# Patient Record
Sex: Female | Born: 1965 | Race: White | Hispanic: No | Marital: Married | Smoking: Never smoker
Health system: Southern US, Community
[De-identification: ages and names within clinical notes are randomized; demographics above are authoritative.]

## PROBLEM LIST (undated history)

## (undated) HISTORY — PX: ABDOMINAL HYSTERECTOMY: SHX81

---

## 2010-08-19 ENCOUNTER — Ambulatory Visit: Payer: Self-pay | Admitting: Obstetrics and Gynecology

## 2015-03-15 ENCOUNTER — Other Ambulatory Visit: Payer: Self-pay | Admitting: Student

## 2015-03-15 DIAGNOSIS — R1011 Right upper quadrant pain: Secondary | ICD-10-CM

## 2015-03-22 ENCOUNTER — Ambulatory Visit
Admission: RE | Admit: 2015-03-22 | Discharge: 2015-03-22 | Disposition: A | Discharge status: Home / Self Care | Payer: BLUE CROSS/BLUE SHIELD | Source: Ambulatory Visit | Attending: Student | Admitting: Student

## 2015-03-22 DIAGNOSIS — R1011 Right upper quadrant pain: Secondary | ICD-10-CM | POA: Diagnosis present

## 2015-03-22 DIAGNOSIS — K760 Fatty (change of) liver, not elsewhere classified: Secondary | ICD-10-CM | POA: Diagnosis not present

## 2015-05-24 ENCOUNTER — Other Ambulatory Visit: Payer: Self-pay | Admitting: Nurse Practitioner

## 2015-05-24 DIAGNOSIS — E049 Nontoxic goiter, unspecified: Secondary | ICD-10-CM

## 2015-05-31 ENCOUNTER — Ambulatory Visit
Admission: RE | Admit: 2015-05-31 | Discharge: 2015-05-31 | Disposition: A | Discharge status: Home / Self Care | Payer: BLUE CROSS/BLUE SHIELD | Source: Ambulatory Visit | Attending: Nurse Practitioner | Admitting: Nurse Practitioner

## 2015-05-31 DIAGNOSIS — E049 Nontoxic goiter, unspecified: Secondary | ICD-10-CM | POA: Diagnosis not present

## 2015-10-08 ENCOUNTER — Other Ambulatory Visit: Payer: Self-pay | Admitting: Nurse Practitioner

## 2015-10-08 DIAGNOSIS — Z1231 Encounter for screening mammogram for malignant neoplasm of breast: Secondary | ICD-10-CM

## 2015-10-18 ENCOUNTER — Ambulatory Visit
Admission: RE | Admit: 2015-10-18 | Discharge: 2015-10-18 | Disposition: A | Discharge status: Home / Self Care | Payer: BLUE CROSS/BLUE SHIELD | Source: Ambulatory Visit | Attending: Nurse Practitioner | Admitting: Nurse Practitioner

## 2015-10-18 ENCOUNTER — Other Ambulatory Visit: Payer: Self-pay | Admitting: Nurse Practitioner

## 2015-10-18 DIAGNOSIS — Z1231 Encounter for screening mammogram for malignant neoplasm of breast: Secondary | ICD-10-CM | POA: Diagnosis not present

## 2016-10-23 ENCOUNTER — Other Ambulatory Visit: Payer: Self-pay | Admitting: Nurse Practitioner

## 2016-10-23 DIAGNOSIS — Z1239 Encounter for other screening for malignant neoplasm of breast: Secondary | ICD-10-CM

## 2016-12-02 ENCOUNTER — Encounter: Payer: Self-pay | Admitting: Radiology

## 2016-12-02 ENCOUNTER — Ambulatory Visit
Admission: RE | Admit: 2016-12-02 | Discharge: 2016-12-02 | Disposition: A | Discharge status: Home / Self Care | Payer: BLUE CROSS/BLUE SHIELD | Source: Ambulatory Visit | Attending: Nurse Practitioner | Admitting: Nurse Practitioner

## 2016-12-02 DIAGNOSIS — Z1239 Encounter for other screening for malignant neoplasm of breast: Secondary | ICD-10-CM

## 2016-12-02 DIAGNOSIS — Z1231 Encounter for screening mammogram for malignant neoplasm of breast: Secondary | ICD-10-CM | POA: Diagnosis not present

## 2017-09-20 ENCOUNTER — Ambulatory Visit (INDEPENDENT_AMBULATORY_CARE_PROVIDER_SITE_OTHER): Payer: BLUE CROSS/BLUE SHIELD

## 2017-09-20 ENCOUNTER — Ambulatory Visit
Admission: EM | Admit: 2017-09-20 | Discharge: 2017-09-20 | Disposition: A | Discharge status: Home / Self Care | Payer: BLUE CROSS/BLUE SHIELD | Attending: Family Medicine | Admitting: Family Medicine

## 2017-09-20 ENCOUNTER — Other Ambulatory Visit: Payer: Self-pay

## 2017-09-20 DIAGNOSIS — S022XXA Fracture of nasal bones, initial encounter for closed fracture: Secondary | ICD-10-CM

## 2017-09-20 DIAGNOSIS — W228XXA Striking against or struck by other objects, initial encounter: Secondary | ICD-10-CM | POA: Diagnosis not present

## 2017-09-20 DIAGNOSIS — W19XXXA Unspecified fall, initial encounter: Secondary | ICD-10-CM

## 2017-09-20 DIAGNOSIS — W108XXA Fall (on) (from) other stairs and steps, initial encounter: Secondary | ICD-10-CM

## 2017-09-20 DIAGNOSIS — Z23 Encounter for immunization: Secondary | ICD-10-CM | POA: Diagnosis not present

## 2017-09-20 MED ORDER — PHENYLEPHRINE HCL 0.25 % NA SOLN: 1.0000 | Freq: Four times a day (QID) | NASAL | Status: DC | PRN

## 2017-09-20 MED ORDER — TETANUS-DIPHTH-ACELL PERTUSSIS 5-2.5-18.5 LF-MCG/0.5 IM SUSP
0.5000 mL | Freq: Once | INTRAMUSCULAR | Status: AC
  Administered 2017-09-20: 0.5 mL via INTRAMUSCULAR

## 2017-09-20 NOTE — Discharge Instructions (Signed)
-  ice compresses for 10-15 minutes 4-6 times a day -can use the nasal spray, 4-6 sprays into the nostril for bleeding. Afrin -follow up with ENT next week, info provided -go to ER if have heavy bleeding that cannot stop for have difficulty breathing

## 2017-09-20 NOTE — ED Provider Notes (Signed)
[facility]    CSN: [id number] Arrival date & time: 09/20/17  0855     History   Chief Complaint Chief Complaint  Patient presents with  . Fall  . Facial Pain    HPI Alisha Pierce is a 52 y.o. female.   Patient is a 52 year old female who states she was walking up the steps at work carrying items in both hands when she tripped and fell forward, hitting her nose on the metal handrail.  Patient states the injury occurred about 805 this morning.  Patient denies any trouble with air movement through her nose but does report it is painful to touch.  She did report some heavy bleeding for about 30 minutes but now reports just a little bit of oozing bleeding.  She does note that there is some bruising already started.  Patient does take 81 mg of aspirin every morning.  She also states that she took 600 milligrams of ibuprofen at about 830 this morning.  Patient does not remember when her last tetanus shot was      History reviewed. No pertinent past medical history.  There are no active problems to display for this patient.   Past Surgical History:  Procedure Laterality Date  . ABDOMINAL HYSTERECTOMY    . CESAREAN SECTION      OB History    No data available       Home Medications    Prior to Admission medications   Medication Sig Start Date End Date Taking? Authorizing Provider  cefdinir (OMNICEF) 300 MG capsule Take 300 mg by mouth 2 (two) times daily.   Yes [provider]  levothyroxine (SYNTHROID, LEVOTHROID) 75 MCG tablet Take 75 mcg by mouth daily before breakfast.   Yes [provider]  metFORMIN (GLUCOPHAGE) 1000 MG tablet Take 1,000 mg by mouth 2 (two) times daily with a meal.   Yes [provider]  triamterene-hydrochlorothiazide (DYAZIDE) 37.5-25 MG capsule Take 1 capsule by mouth daily.   Yes [provider]    Family History Family History  Problem Relation Age of Onset  . Diabetes Father     Social  History Social History   Tobacco Use  . Smoking status: Never Smoker  . Smokeless tobacco: Never Used  Substance Use Topics  . Alcohol use: Yes    Frequency: Never    Comment: occasionally  . Drug use: No     Allergies   Patient has no known allergies.   Review of Systems Review of Systems  As noted above in HPI.  Other systems reviewed and found to be negative   Physical Exam Triage Vital Signs ED Triage Vitals  Enc Vitals Group     BP 09/20/17 0931 (!) 126/98     Pulse Rate 09/20/17 0931 (!) 102     Resp 09/20/17 0931 18     Temp 09/20/17 0931 98.5 F (36.9 C)     Temp Source 09/20/17 0931 Oral     SpO2 09/20/17 0931 98 %     Weight 09/20/17 0928 175 lb (79.4 kg)     Height 09/20/17 0928 5\' 4"  (1.626 m)     Head Circumference --      Peak Flow --      Pain Score 09/20/17 0928 5     Pain Loc --      Pain Edu? --      Excl. in GC? --    No data found.  Updated Vital Signs BP Marland Kitchen)  126/98 (BP Location: Left Arm)   Pulse (!) 102   Temp 98.5 F (36.9 C) (Oral)   Resp 18   Ht 5\' 4"  (1.626 m)   Wt 175 lb (79.4 kg)   SpO2 98%   BMI 30.04 kg/m    Physical Exam  Constitutional: She appears well-developed and well-nourished. No distress.  HENT:  Nose:    About 4 areas of minimal oozing bleeding.  No obvious large lacerations or sores inside the nose.  Some asymmetry in regards to the alignment of the nose though minimal,   Eyes: Pupils are equal, round, and reactive to light.  Pulmonary/Chest: Breath sounds normal. No stridor. No respiratory distress.     UC Treatments / Results  Labs (all labs ordered are listed, but only abnormal results are displayed) Labs Reviewed - No data to display  EKG  EKG Interpretation None       Radiology Dg Nasal Bones  Result Date: 09/20/2017 CLINICAL DATA:  Pain following fall EXAM: NASAL BONES - 3+ VIEW COMPARISON:  None. FINDINGS: Right lateral, left lateral, and Waters views obtained. There is a fracture of  the distal nasion with mild displacement of fracture fragments. No other fractures evident. No dislocation. Paranasal sinuses appear clear. Mastoid air cells are clear. IMPRESSION: Mildly displaced fracture distal nasion. No other abnormality appreciable. Electronically Signed   By: Bretta BangWilliam  Woodruff III M.D.   On: 09/20/2017 11:03    Procedures Procedures (including critical care time)  Medications Ordered in UC Medications  phenylephrine (NEO-SYNEPHRINE) 0.25 % nasal spray 1 spray (not administered)  Tdap (BOOSTRIX) injection 0.5 mL (0.5 mLs Intramuscular Given 09/20/17 1104)     Initial Impression / Assessment and Plan / UC Course  I have reviewed the triage vital signs and the nursing notes.  Pertinent labs & imaging results that were available during my care of the patient were reviewed by me and considered in my medical decision making (see chart for details).    Patient with fall at work while walking up steps, striking her nose on a metal handrail.  Patient denies any respiratory difficulty or obstruction of flow through her nose.  Patient does take an 81 mg baby aspirin daily  A Neo-Synephrine nasal solution was soaked into a 4 x 4 gauze and inserted into the left naris to help control bleeding.  Ice pack was placed upon arrival.  Due to early bruising and some tenderness asymmetric look to the nose, a nasal x-ray was ordered.  Final Clinical Impressions(s) / UC Diagnoses   Final diagnoses:  Fall, initial encounter  Closed fracture of nasal bone, initial encounter    ED Discharge Orders    None     Patient with distal nasal bone fracture as above.  We will have her continue with ibuprofen and Tylenol as needed for pain.  Ice compresses 4-6 times a day.  I have her continue using the nasal spray as needed for any bleeding.  Have her go to the ER should her bleeding become heavy and she is unable to stop it or should she have any difficulty breathing.  We will have her  follow-up with ENT next week.   Controlled Substance Prescriptions [place] Controlled Substance Registry consulted? Not Applicable   Candis SchatzHarris, Norberta Stobaugh D, PA-C 09/20/17 1122

## 2017-09-20 NOTE — ED Triage Notes (Signed)
Patient reports that was walking in to work this morning and fell up concrete steps and landed on her face. Patient has significant nasal swelling and beginning to bruise. Patient reports nose bleed but has resolved at this time. Patient currently applying pressure and ice to face.

## 2017-10-20 ENCOUNTER — Other Ambulatory Visit: Payer: Self-pay | Admitting: Nurse Practitioner

## 2017-10-20 DIAGNOSIS — S0292XS Unspecified fracture of facial bones, sequela: Secondary | ICD-10-CM

## 2017-10-20 DIAGNOSIS — R42 Dizziness and giddiness: Secondary | ICD-10-CM

## 2017-10-20 DIAGNOSIS — H538 Other visual disturbances: Secondary | ICD-10-CM

## 2017-10-20 DIAGNOSIS — R51 Headache: Secondary | ICD-10-CM

## 2017-10-20 DIAGNOSIS — R519 Headache, unspecified: Secondary | ICD-10-CM

## 2017-10-22 ENCOUNTER — Encounter (INDEPENDENT_AMBULATORY_CARE_PROVIDER_SITE_OTHER): Payer: Self-pay

## 2017-10-22 ENCOUNTER — Ambulatory Visit
Admission: RE | Admit: 2017-10-22 | Discharge: 2017-10-22 | Disposition: A | Discharge status: Home / Self Care | Payer: BLUE CROSS/BLUE SHIELD | Source: Ambulatory Visit | Attending: Nurse Practitioner | Admitting: Nurse Practitioner

## 2017-10-22 DIAGNOSIS — R42 Dizziness and giddiness: Secondary | ICD-10-CM

## 2017-10-22 DIAGNOSIS — R51 Headache: Secondary | ICD-10-CM | POA: Insufficient documentation

## 2017-10-22 DIAGNOSIS — R519 Headache, unspecified: Secondary | ICD-10-CM

## 2017-10-22 DIAGNOSIS — H538 Other visual disturbances: Secondary | ICD-10-CM

## 2017-10-22 DIAGNOSIS — S0292XS Unspecified fracture of facial bones, sequela: Secondary | ICD-10-CM | POA: Insufficient documentation

## 2018-01-18 ENCOUNTER — Other Ambulatory Visit: Payer: Self-pay | Admitting: Nurse Practitioner

## 2018-01-18 DIAGNOSIS — Z1231 Encounter for screening mammogram for malignant neoplasm of breast: Secondary | ICD-10-CM

## 2018-02-02 ENCOUNTER — Ambulatory Visit
Admission: RE | Admit: 2018-02-02 | Discharge: 2018-02-02 | Disposition: A | Discharge status: Home / Self Care | Payer: BLUE CROSS/BLUE SHIELD | Source: Ambulatory Visit | Attending: Nurse Practitioner | Admitting: Nurse Practitioner

## 2018-02-02 DIAGNOSIS — Z1231 Encounter for screening mammogram for malignant neoplasm of breast: Secondary | ICD-10-CM | POA: Diagnosis not present

## 2018-07-29 ENCOUNTER — Other Ambulatory Visit: Payer: Self-pay | Admitting: Student

## 2018-07-29 DIAGNOSIS — R1011 Right upper quadrant pain: Secondary | ICD-10-CM

## 2018-07-29 DIAGNOSIS — R110 Nausea: Secondary | ICD-10-CM

## 2018-08-09 ENCOUNTER — Ambulatory Visit: Payer: BLUE CROSS/BLUE SHIELD

## 2018-08-19 ENCOUNTER — Ambulatory Visit
Admission: RE | Admit: 2018-08-19 | Discharge: 2018-08-19 | Disposition: A | Discharge status: Home / Self Care | Payer: BLUE CROSS/BLUE SHIELD | Source: Ambulatory Visit | Attending: Student | Admitting: Student

## 2018-08-19 DIAGNOSIS — R1011 Right upper quadrant pain: Secondary | ICD-10-CM | POA: Diagnosis present

## 2018-08-19 DIAGNOSIS — R110 Nausea: Secondary | ICD-10-CM

## 2018-08-19 MED ORDER — TECHNETIUM TC 99M MEBROFENIN IV KIT
5.3000 | PACK | Freq: Once | INTRAVENOUS | Status: AC | PRN
  Administered 2018-08-19: 5.3 via INTRAVENOUS

## 2018-08-30 ENCOUNTER — Other Ambulatory Visit: Payer: Self-pay | Admitting: Student

## 2018-08-30 DIAGNOSIS — R1011 Right upper quadrant pain: Secondary | ICD-10-CM

## 2018-09-09 ENCOUNTER — Other Ambulatory Visit: Payer: Self-pay | Admitting: Student

## 2018-09-09 DIAGNOSIS — R1011 Right upper quadrant pain: Secondary | ICD-10-CM

## 2018-09-09 DIAGNOSIS — K30 Functional dyspepsia: Secondary | ICD-10-CM

## 2018-09-09 DIAGNOSIS — R110 Nausea: Secondary | ICD-10-CM

## 2018-11-04 ENCOUNTER — Ambulatory Visit: Admission: RE | Admit: 2018-11-04 | Payer: BLUE CROSS/BLUE SHIELD | Source: Ambulatory Visit

## 2019-03-27 ENCOUNTER — Other Ambulatory Visit: Payer: Self-pay | Admitting: Nurse Practitioner

## 2019-03-27 DIAGNOSIS — Z1231 Encounter for screening mammogram for malignant neoplasm of breast: Secondary | ICD-10-CM

## 2019-04-10 ENCOUNTER — Ambulatory Visit
Admission: RE | Admit: 2019-04-10 | Discharge: 2019-04-10 | Disposition: A | Discharge status: Home / Self Care | Payer: BC Managed Care – PPO | Source: Ambulatory Visit | Attending: Nurse Practitioner | Admitting: Nurse Practitioner

## 2019-04-10 ENCOUNTER — Other Ambulatory Visit: Payer: Self-pay

## 2019-04-10 ENCOUNTER — Encounter (INDEPENDENT_AMBULATORY_CARE_PROVIDER_SITE_OTHER): Payer: Self-pay

## 2019-04-10 DIAGNOSIS — Z1231 Encounter for screening mammogram for malignant neoplasm of breast: Secondary | ICD-10-CM | POA: Insufficient documentation

## 2019-12-13 ENCOUNTER — Other Ambulatory Visit: Payer: Self-pay | Admitting: Student

## 2019-12-13 DIAGNOSIS — R1011 Right upper quadrant pain: Secondary | ICD-10-CM

## 2019-12-13 DIAGNOSIS — R1032 Left lower quadrant pain: Secondary | ICD-10-CM

## 2019-12-20 ENCOUNTER — Ambulatory Visit
Admission: RE | Admit: 2019-12-20 | Discharge: 2019-12-20 | Disposition: A | Discharge status: Home / Self Care | Payer: BC Managed Care – PPO | Source: Ambulatory Visit | Attending: Student | Admitting: Student

## 2019-12-20 DIAGNOSIS — R1011 Right upper quadrant pain: Secondary | ICD-10-CM

## 2019-12-20 DIAGNOSIS — R1032 Left lower quadrant pain: Secondary | ICD-10-CM

## 2019-12-20 MED ORDER — IOPAMIDOL (ISOVUE-300) INJECTION 61%
100.0000 mL | Freq: Once | INTRAVENOUS | Status: AC | PRN
  Administered 2019-12-20: 100 mL via INTRAVENOUS

## 2020-05-07 ENCOUNTER — Other Ambulatory Visit: Payer: Self-pay | Admitting: Nurse Practitioner

## 2020-05-07 DIAGNOSIS — Z1231 Encounter for screening mammogram for malignant neoplasm of breast: Secondary | ICD-10-CM

## 2020-06-25 ENCOUNTER — Other Ambulatory Visit: Payer: Self-pay

## 2020-06-25 ENCOUNTER — Ambulatory Visit
Admission: RE | Admit: 2020-06-25 | Discharge: 2020-06-25 | Disposition: A | Discharge status: Home / Self Care | Payer: BC Managed Care – PPO | Source: Ambulatory Visit | Attending: Nurse Practitioner | Admitting: Nurse Practitioner

## 2020-06-25 DIAGNOSIS — Z1231 Encounter for screening mammogram for malignant neoplasm of breast: Secondary | ICD-10-CM | POA: Insufficient documentation

## 2021-05-27 ENCOUNTER — Other Ambulatory Visit: Payer: Self-pay | Admitting: Nurse Practitioner

## 2021-05-27 DIAGNOSIS — Z1231 Encounter for screening mammogram for malignant neoplasm of breast: Secondary | ICD-10-CM

## 2021-05-28 ENCOUNTER — Other Ambulatory Visit: Payer: Self-pay | Admitting: Nurse Practitioner

## 2021-05-28 DIAGNOSIS — R1011 Right upper quadrant pain: Secondary | ICD-10-CM

## 2021-06-04 ENCOUNTER — Other Ambulatory Visit: Payer: Self-pay

## 2021-06-04 ENCOUNTER — Ambulatory Visit
Admission: RE | Admit: 2021-06-04 | Discharge: 2021-06-04 | Disposition: A | Discharge status: Home / Self Care | Payer: BC Managed Care – PPO | Source: Ambulatory Visit | Attending: Nurse Practitioner | Admitting: Nurse Practitioner

## 2021-06-04 DIAGNOSIS — R1011 Right upper quadrant pain: Secondary | ICD-10-CM | POA: Insufficient documentation

## 2021-06-26 ENCOUNTER — Other Ambulatory Visit: Payer: Self-pay

## 2021-06-26 ENCOUNTER — Ambulatory Visit
Admission: RE | Admit: 2021-06-26 | Discharge: 2021-06-26 | Disposition: A | Discharge status: Home / Self Care | Payer: BC Managed Care – PPO | Source: Ambulatory Visit | Attending: Nurse Practitioner | Admitting: Nurse Practitioner

## 2021-06-26 DIAGNOSIS — Z1231 Encounter for screening mammogram for malignant neoplasm of breast: Secondary | ICD-10-CM | POA: Diagnosis not present

## 2021-06-27 ENCOUNTER — Other Ambulatory Visit: Payer: Self-pay | Admitting: Nurse Practitioner

## 2021-07-02 ENCOUNTER — Other Ambulatory Visit: Payer: Self-pay | Admitting: Nurse Practitioner

## 2021-07-02 DIAGNOSIS — R921 Mammographic calcification found on diagnostic imaging of breast: Secondary | ICD-10-CM

## 2021-07-02 DIAGNOSIS — R928 Other abnormal and inconclusive findings on diagnostic imaging of breast: Secondary | ICD-10-CM

## 2021-07-04 ENCOUNTER — Ambulatory Visit
Admission: RE | Admit: 2021-07-04 | Discharge: 2021-07-04 | Disposition: A | Discharge status: Home / Self Care | Payer: BC Managed Care – PPO | Source: Ambulatory Visit | Attending: Nurse Practitioner | Admitting: Nurse Practitioner

## 2021-07-04 ENCOUNTER — Other Ambulatory Visit: Payer: Self-pay

## 2021-07-04 DIAGNOSIS — R921 Mammographic calcification found on diagnostic imaging of breast: Secondary | ICD-10-CM | POA: Diagnosis present

## 2021-07-04 DIAGNOSIS — R928 Other abnormal and inconclusive findings on diagnostic imaging of breast: Secondary | ICD-10-CM | POA: Diagnosis present

## 2022-08-31 ENCOUNTER — Other Ambulatory Visit: Payer: Self-pay | Admitting: Nurse Practitioner

## 2022-08-31 DIAGNOSIS — Z1231 Encounter for screening mammogram for malignant neoplasm of breast: Secondary | ICD-10-CM

## 2022-09-18 ENCOUNTER — Ambulatory Visit
Admission: RE | Admit: 2022-09-18 | Discharge: 2022-09-18 | Disposition: A | Discharge status: Home / Self Care | Payer: BC Managed Care – PPO | Source: Ambulatory Visit | Attending: Nurse Practitioner | Admitting: Nurse Practitioner

## 2022-09-18 DIAGNOSIS — Z1231 Encounter for screening mammogram for malignant neoplasm of breast: Secondary | ICD-10-CM | POA: Insufficient documentation

## 2023-02-19 IMAGING — MG MM DIGITAL DIAGNOSTIC UNILAT*R* W/ TOMO W/ CAD
4 series · 4 of 8 positions shown · non-contrast
Comparison: Previous exam(s).

CLINICAL DATA: 55-year-old female recalled from screening mammogram
dated 06/26/2021 for right breast calcifications.

EXAM:
DIGITAL DIAGNOSTIC UNILATERAL RIGHT MAMMOGRAM WITH TOMOSYNTHESIS AND
CAD
TECHNIQUE: Right digital diagnostic mammography and breast tomosynthesis was
performed. The images were evaluated with computer-aided detection.

[R ML]
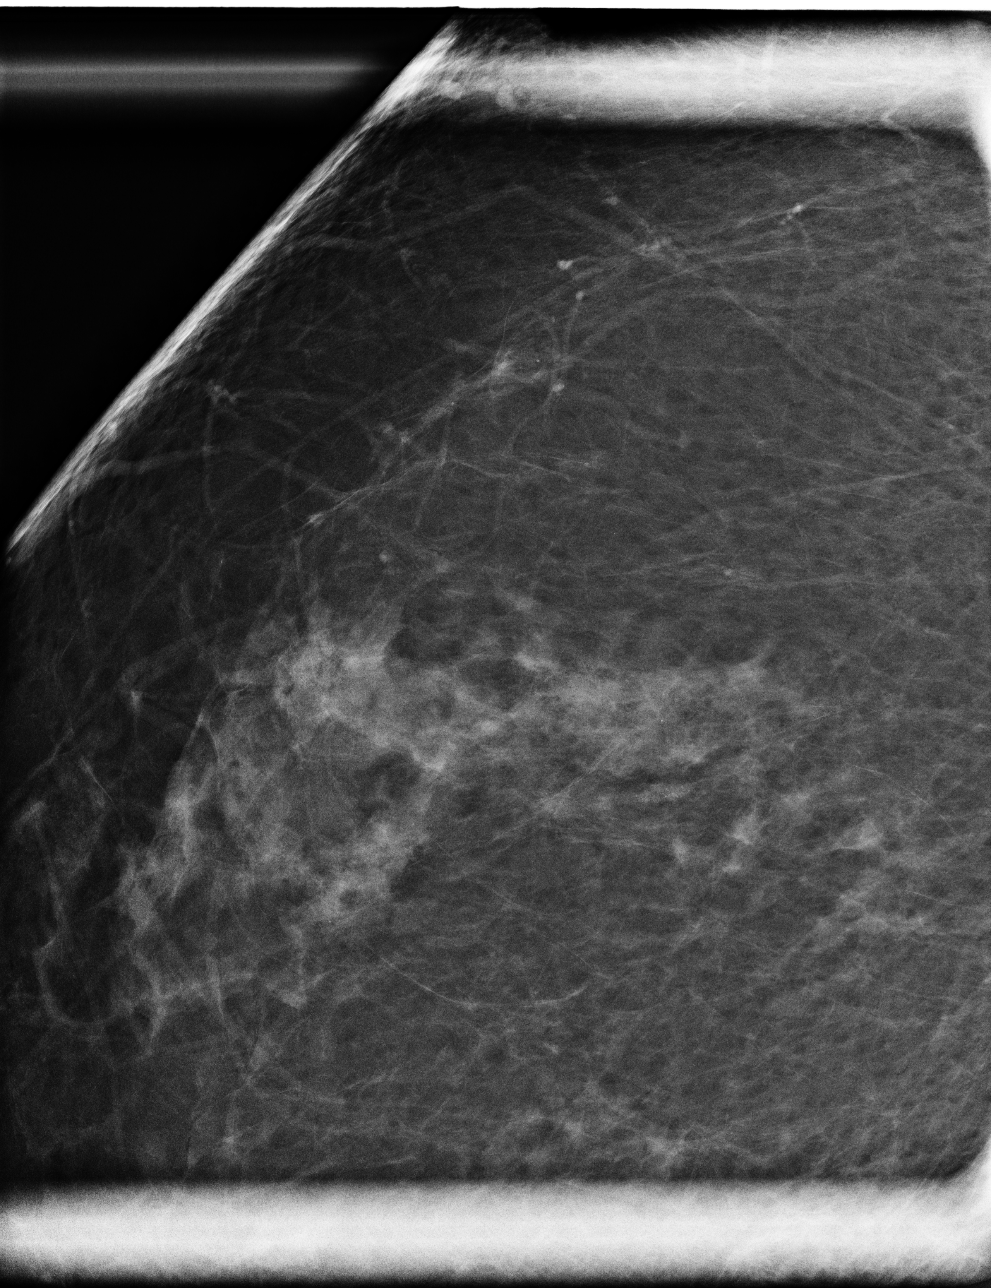

[R CC]
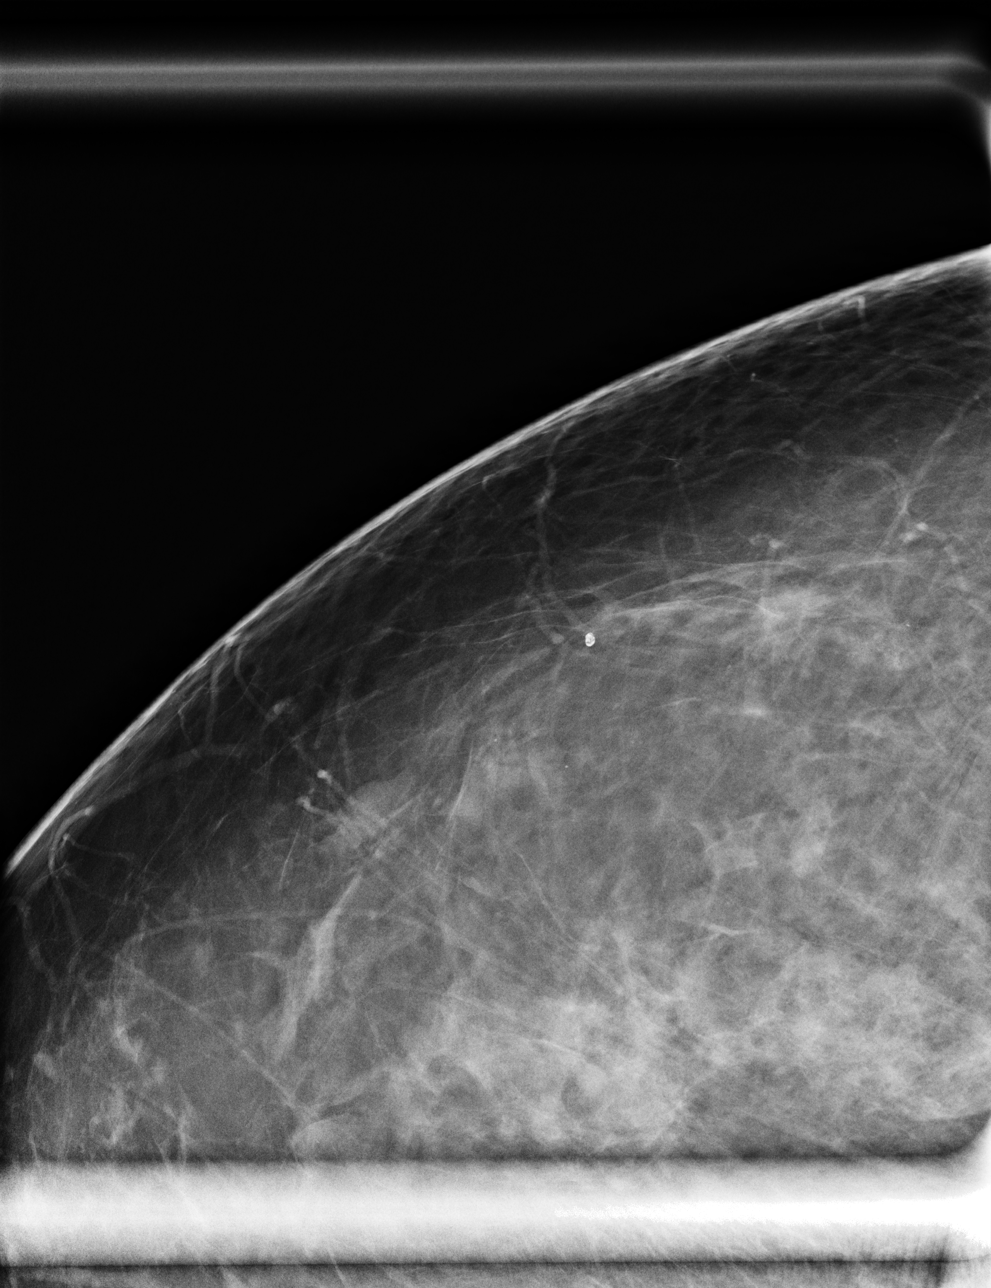

[R ML synth-2D]
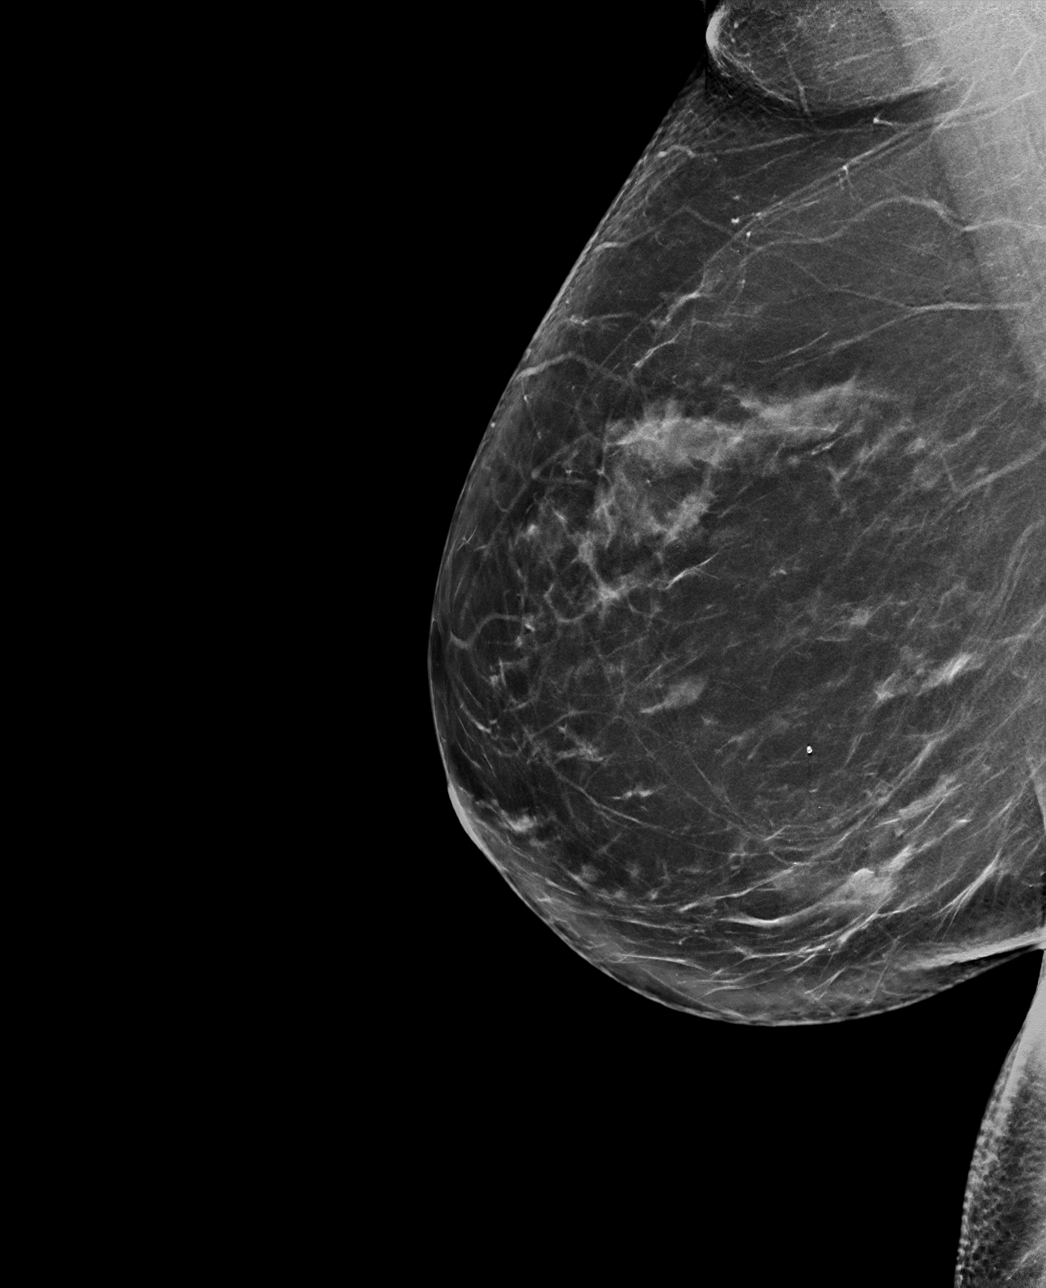

[R ML tomo · tomo slice 39/76.0]
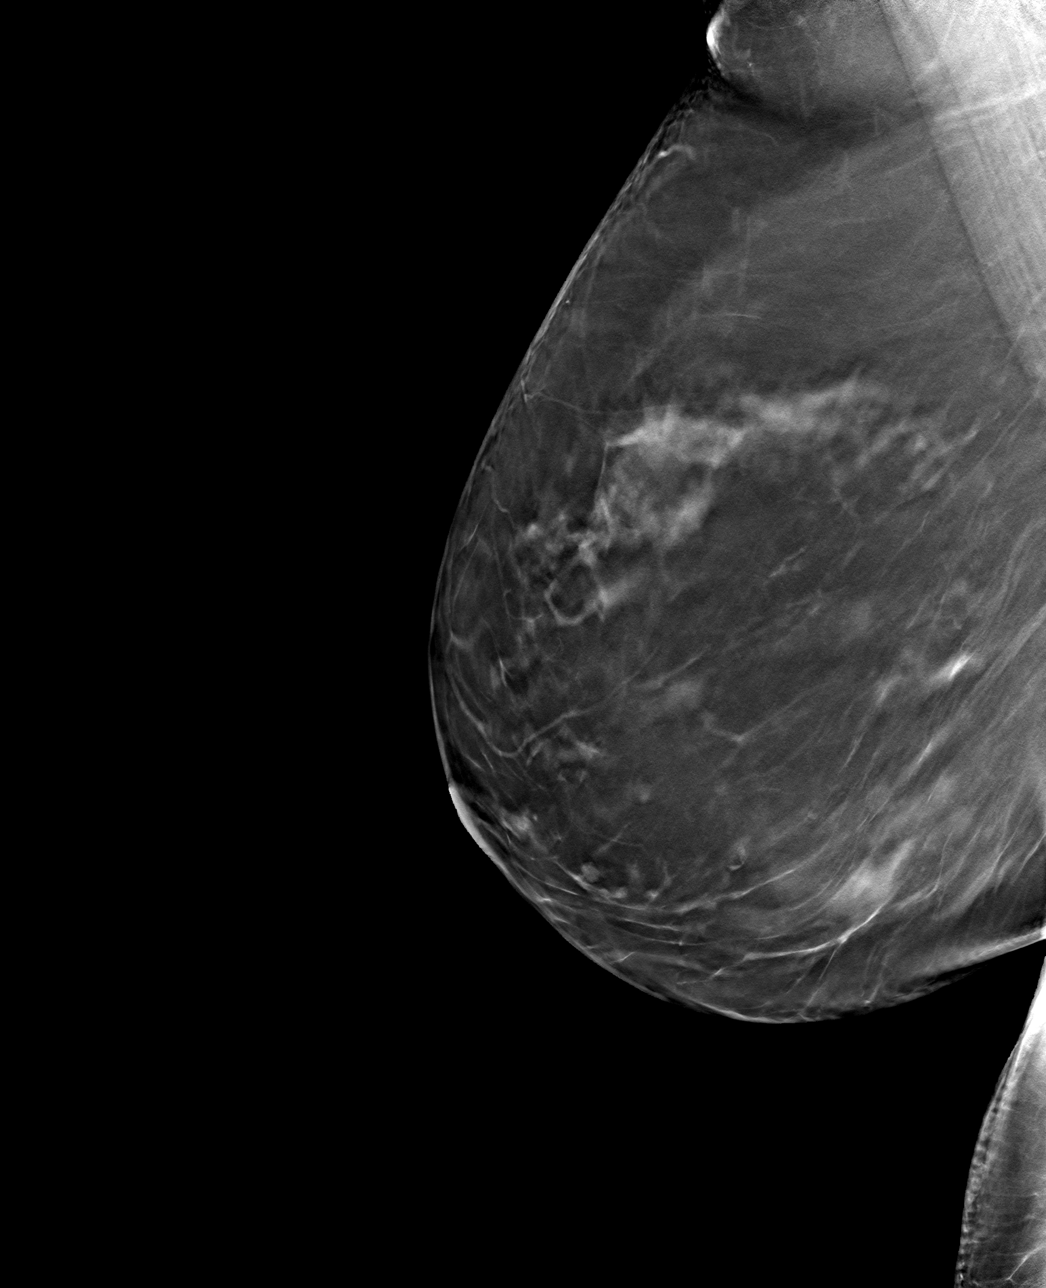

[4 of 8 positions shown; findings below may reference images not displayed]

ACR Breast Density Category b: There are scattered areas of
fibroglandular density.
FINDINGS: There is a single coarse calcification and scattered punctate
calcifications in the upper-outer quadrant of the right breast.
Previously questioned calcifications on the screening mammogram were
likely due to superimposition from the synthetic view.
IMPRESSION: Benign right breast calcifications.

RECOMMENDATION:
Screening mammogram in one year.(Code:[id number])

I have discussed the findings and recommendations with the patient.
If applicable, a reminder letter will be sent to the patient
regarding the next appointment.

BI-RADS CATEGORY  2: Benign.

## 2023-08-03 ENCOUNTER — Other Ambulatory Visit: Payer: Self-pay | Admitting: Nurse Practitioner

## 2023-08-03 DIAGNOSIS — Z1231 Encounter for screening mammogram for malignant neoplasm of breast: Secondary | ICD-10-CM

## 2023-09-21 ENCOUNTER — Ambulatory Visit
Admission: RE | Admit: 2023-09-21 | Discharge: 2023-09-21 | Disposition: A | Discharge status: Home / Self Care | Payer: BC Managed Care – PPO | Source: Ambulatory Visit | Attending: Nurse Practitioner | Admitting: Nurse Practitioner

## 2023-09-21 DIAGNOSIS — Z1231 Encounter for screening mammogram for malignant neoplasm of breast: Secondary | ICD-10-CM | POA: Insufficient documentation
